# Patient Record
Sex: Female | Born: 1995 | Race: White | Hispanic: No | Marital: Single | Smoking: Former smoker
Health system: Southern US, Community
[De-identification: ages and names within clinical notes are randomized; demographics above are authoritative.]

---
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
## 2016-05-19 ENCOUNTER — Encounter (HOSPITAL_BASED_OUTPATIENT_CLINIC_OR_DEPARTMENT_OTHER): Payer: Self-pay | Admitting: Emergency Medicine

## 2016-05-19 ENCOUNTER — Emergency Department (HOSPITAL_BASED_OUTPATIENT_CLINIC_OR_DEPARTMENT_OTHER): Payer: Self-pay

## 2016-05-19 ENCOUNTER — Emergency Department (HOSPITAL_BASED_OUTPATIENT_CLINIC_OR_DEPARTMENT_OTHER)
Admission: EM | Admit: 2016-05-19 | Discharge: 2016-05-19 | Disposition: A | Discharge status: Home / Self Care | Payer: Self-pay | Attending: Emergency Medicine | Admitting: Emergency Medicine

## 2016-05-19 DIAGNOSIS — Z23 Encounter for immunization: Secondary | ICD-10-CM | POA: Insufficient documentation

## 2016-05-19 DIAGNOSIS — Y9389 Activity, other specified: Secondary | ICD-10-CM | POA: Insufficient documentation

## 2016-05-19 DIAGNOSIS — Y990 Civilian activity done for income or pay: Secondary | ICD-10-CM | POA: Insufficient documentation

## 2016-05-19 DIAGNOSIS — S61112A Laceration without foreign body of left thumb with damage to nail, initial encounter: Secondary | ICD-10-CM | POA: Insufficient documentation

## 2016-05-19 DIAGNOSIS — Z87891 Personal history of nicotine dependence: Secondary | ICD-10-CM | POA: Insufficient documentation

## 2016-05-19 DIAGNOSIS — Y929 Unspecified place or not applicable: Secondary | ICD-10-CM | POA: Insufficient documentation

## 2016-05-19 DIAGNOSIS — W268XXA Contact with other sharp object(s), not elsewhere classified, initial encounter: Secondary | ICD-10-CM | POA: Insufficient documentation

## 2016-05-19 MED ORDER — TETANUS-DIPHTH-ACELL PERTUSSIS 5-2.5-18.5 LF-MCG/0.5 IM SUSP
0.5000 mL | Freq: Once | INTRAMUSCULAR | Status: AC
  Administered 2016-05-19: 0.5 mL via INTRAMUSCULAR
  Filled 2016-05-19: qty 0.5

## 2016-05-19 NOTE — ED Notes (Signed)
Patient returned from X-ray 

## 2016-05-19 NOTE — ED Triage Notes (Signed)
Patient reports she was cutting salad when she "cut the tip of her thumb off", 2-1/2 days ago. Patient reports she placed a bandaid on it, bandaid in place at this time. Patient last tetanus shot unknown.

## 2016-05-19 NOTE — ED Provider Notes (Signed)
[facility] Provider Note   CSN: [id number] Arrival date & time: 05/19/16  1641  By signing my name below, I, Jillian Giles, attest that this documentation has been prepared under the direction and in the presence of Geoffery Lyons, MD. Electronically Signed: Cynda Giles, Scribe. 05/19/16. 5:49 PM.  History   Chief Complaint Chief Complaint  Patient presents with  . Extremity Laceration    left thumb   HPI Comments: Jillian Giles is a 21 y.o. female with no pertinence medical history, who presents to the Emergency Department complaining of a laceration to the left thumb that happened 2 days ago. Patient states she sliced her finger while cutting lettuce at work. Patient states she was unable to get to the hospital because she had to work and this is her first day off. Patient reports associated nail discoloration. Patient reports applying a bandage and pressure to the left thumb with no relief. Last tetanus shot is unknown. Patient denies any nausea, vomiting, fever, or any other symptoms.   The history is provided by the patient. No language interpreter was used.    History reviewed. No pertinent past medical history.  There are no active problems to display for this patient.   History reviewed. No pertinent surgical history.  OB History    No data available       Home Medications    Prior to Admission medications   Medication Sig Start Date End Date Taking? Authorizing Provider  citalopram (CELEXA) 20 MG tablet Take 20 mg by mouth daily.   Yes Historical Provider, MD    Family History History reviewed. No pertinent family history.  Social History Social History  Substance Use Topics  . Smoking status: Former Games developer  . Smokeless tobacco: Never Used  . Alcohol use Yes     Comment: occ     Allergies   Patient has no known allergies.   Review of Systems Review of Systems  Constitutional: Negative for fever.  Gastrointestinal: Negative for nausea  and vomiting.  Skin: Positive for color change and wound (left thumb).  All other systems reviewed and are negative.    Physical Exam Updated Vital Signs BP 110/77 (BP Location: Left Arm)   Pulse 60   Temp 98.5 F (36.9 C) (Oral)   Resp 18   Ht  (1.6 m)   Wt 121 lb (54.9 kg)   LMP 05/08/2016 (Approximate)   SpO2 100%   BMI 21.43 kg/m   Physical Exam  Constitutional: She is oriented to person, place, and time. She appears well-developed and well-nourished. No distress.  HENT:  Head: Normocephalic and atraumatic.  Eyes: EOM are normal.  Neck: Normal range of motion.  Cardiovascular: Normal rate, regular rhythm and normal heart sounds.   Pulmonary/Chest: Effort normal and breath sounds normal.  Abdominal: Soft. She exhibits no distension. There is no tenderness.  Musculoskeletal: Normal range of motion.  Neurological: She is alert and oriented to person, place, and time.  Skin: Skin is warm and dry.  Slight soft tissue amputation of the distal tip of the left thumb. Slight nail involvement.   Psychiatric: She has a normal mood and affect. Judgment normal.  Nursing note and vitals reviewed.    ED Treatments / Results  DIAGNOSTIC STUDIES: Oxygen Saturation is 100% on RA, normal by my interpretation.    COORDINATION OF CARE: 5:48 PM Discussed treatment plan with pt at bedside and pt agreed to plan, which includes a tetanus shot and a splint.  Labs (all labs ordered are listed, but only abnormal results are displayed) Labs Reviewed - No data to display  EKG  EKG Interpretation None       Radiology Dg Finger Thumb Left  Result Date: 05/19/2016 CLINICAL DATA:  Laceration to left thumb today.  Initial encounter. EXAM: LEFT THUMB 2+V COMPARISON:  None. FINDINGS: Bandage material is noted over the distal aspect of the thumb. No fracture, dislocation, focal osseous lesion, or radiopaque foreign body is seen. IMPRESSION: Negative. Electronically Signed   By: Sebastian Ache M.D.   On: 05/19/2016 17:28    Procedures Procedures (including critical care time)  Medications Ordered in ED Medications  Tdap (BOOSTRIX) injection 0.5 mL (not administered)     Initial Impression / Assessment and Plan / ED Course  I have reviewed the triage vital signs and the nursing notes.  Pertinent labs & imaging results that were available during my care of the patient were reviewed by me and considered in my medical decision making (see chart for details).  Small avulsion of distal thumb tip. Local wound care, prn return.  Final Clinical Impressions(s) / ED Diagnoses   Final diagnoses:  None    New Prescriptions New Prescriptions   No medications on file   I personally performed the services described in this documentation, which was scribed in my presence. The recorded information has been reviewed and is accurate.        Geoffery Lyons, MD 05/19/16 [phone]

## 2016-05-19 NOTE — Discharge Instructions (Signed)
Local wound care with bacitracin and dressing changes twice daily.  Return to the emergency department for increased redness, pain, pus straining from the wound, or other new and concerning symptoms.

## 2018-12-29 ENCOUNTER — Other Ambulatory Visit: Payer: Self-pay

## 2018-12-29 DIAGNOSIS — Z20822 Contact with and (suspected) exposure to covid-19: Secondary | ICD-10-CM

## 2018-12-30 LAB — NOVEL CORONAVIRUS, NAA: SARS-CoV-2, NAA: NOT DETECTED

## 2019-06-11 ENCOUNTER — Other Ambulatory Visit: Payer: Self-pay

## 2019-06-11 ENCOUNTER — Emergency Department (HOSPITAL_COMMUNITY): Payer: Medicaid Other

## 2019-06-11 ENCOUNTER — Emergency Department (HOSPITAL_COMMUNITY)
Admission: EM | Admit: 2019-06-11 | Discharge: 2019-06-12 | Disposition: A | Discharge status: Home / Self Care | Payer: Medicaid Other | Attending: Emergency Medicine | Admitting: Emergency Medicine

## 2019-06-11 ENCOUNTER — Encounter (HOSPITAL_COMMUNITY): Payer: Self-pay

## 2019-06-11 DIAGNOSIS — F0781 Postconcussional syndrome: Secondary | ICD-10-CM | POA: Insufficient documentation

## 2019-06-11 DIAGNOSIS — S0990XA Unspecified injury of head, initial encounter: Secondary | ICD-10-CM

## 2019-06-11 DIAGNOSIS — Z79899 Other long term (current) drug therapy: Secondary | ICD-10-CM | POA: Insufficient documentation

## 2019-06-11 DIAGNOSIS — Y9352 Activity, horseback riding: Secondary | ICD-10-CM | POA: Insufficient documentation

## 2019-06-11 DIAGNOSIS — Y999 Unspecified external cause status: Secondary | ICD-10-CM | POA: Insufficient documentation

## 2019-06-11 DIAGNOSIS — H538 Other visual disturbances: Secondary | ICD-10-CM | POA: Insufficient documentation

## 2019-06-11 DIAGNOSIS — Y929 Unspecified place or not applicable: Secondary | ICD-10-CM | POA: Insufficient documentation

## 2019-06-11 LAB — DIFFERENTIAL
Abs Immature Granulocytes: 0.01 10*3/uL (ref 0.00–0.07)
Basophils Absolute: 0 10*3/uL (ref 0.0–0.1)
Basophils Relative: 1 %
Eosinophils Absolute: 0.5 10*3/uL (ref 0.0–0.5)
Eosinophils Relative: 6 %
Immature Granulocytes: 0 %
Lymphocytes Relative: 42 %
Lymphs Abs: 3.4 10*3/uL (ref 0.7–4.0)
Monocytes Absolute: 0.7 10*3/uL (ref 0.1–1.0)
Monocytes Relative: 8 %
Neutro Abs: 3.6 10*3/uL (ref 1.7–7.7)
Neutrophils Relative %: 43 %

## 2019-06-11 LAB — CBC
HCT: 42.2 % (ref 36.0–46.0)
Hemoglobin: 14.3 g/dL (ref 12.0–15.0)
MCH: 32.2 pg (ref 26.0–34.0)
MCHC: 33.9 g/dL (ref 30.0–36.0)
MCV: 95 fL (ref 80.0–100.0)
Platelets: 230 10*3/uL (ref 150–400)
RBC: 4.44 MIL/uL (ref 3.87–5.11)
RDW: 12.3 % (ref 11.5–15.5)
WBC: 8.2 10*3/uL (ref 4.0–10.5)
nRBC: 0 % (ref 0.0–0.2)

## 2019-06-11 LAB — COMPREHENSIVE METABOLIC PANEL
ALT: 20 U/L (ref 0–44)
AST: 19 U/L (ref 15–41)
Albumin: 3.6 g/dL (ref 3.5–5.0)
Alkaline Phosphatase: 51 U/L (ref 38–126)
Anion gap: 10 (ref 5–15)
BUN: 9 mg/dL (ref 6–20)
CO2: 23 mmol/L (ref 22–32)
Calcium: 9.2 mg/dL (ref 8.9–10.3)
Chloride: 100 mmol/L (ref 98–111)
Creatinine, Ser: 1 mg/dL (ref 0.44–1.00)
GFR calc Af Amer: >60 mL/min (ref >60)
GFR calc non Af Amer: >60 mL/min (ref >60)
Glucose, Bld: 97 mg/dL (ref 70–99)
Potassium: 3.8 mmol/L (ref 3.5–5.1)
Sodium: 133 mmol/L — ABNORMAL LOW (ref 135–145)
Total Bilirubin: 0.4 mg/dL (ref 0.3–1.2)
Total Protein: 6.6 g/dL (ref 6.5–8.1)

## 2019-06-11 LAB — I-STAT BETA HCG BLOOD, ED (MC, WL, AP ONLY): I-stat hCG, quantitative: <5 m[IU]/mL (ref <5)

## 2019-06-11 LAB — I-STAT CHEM 8, ED
BUN: 12 mg/dL (ref 6–20)
Calcium, Ion: 1.14 mmol/L — ABNORMAL LOW (ref 1.15–1.40)
Chloride: 104 mmol/L (ref 98–111)
Creatinine, Ser: 1 mg/dL (ref 0.44–1.00)
Glucose, Bld: 86 mg/dL (ref 70–99)
HCT: 42 % (ref 36.0–46.0)
Hemoglobin: 14.3 g/dL (ref 12.0–15.0)
Potassium: 4 mmol/L (ref 3.5–5.1)
Sodium: 141 mmol/L (ref 135–145)
TCO2: 29 mmol/L (ref 22–32)

## 2019-06-11 LAB — PROTIME-INR
INR: 1 (ref 0.8–1.2)
Prothrombin Time: 12.8 seconds (ref 11.4–15.2)

## 2019-06-11 LAB — APTT: aPTT: 28 seconds (ref 24–36)

## 2019-06-11 MED ORDER — SODIUM CHLORIDE 0.9% FLUSH
3.0000 mL | Freq: Once | INTRAVENOUS | Status: DC
Start: 2019-06-11 — End: 2019-06-12

## 2019-06-11 NOTE — ED Triage Notes (Addendum)
Pt arrives to ED w/ c/o head injury secondary to being thrown from a horse last weds. Pt does not remember event. Pt endorses intermittent aphasia, headache, and "not feeling herself since then".

## 2019-06-12 NOTE — ED Notes (Signed)
Patient verbalizes understanding of discharge instructions. Opportunity for questioning and answers were provided. Armband removed by staff, pt discharged from ED ambulatory to home.  

## 2019-06-12 NOTE — ED Provider Notes (Signed)
[facility] EMERGENCY DEPARTMENT Provider Note  CSN: [id number] Arrival date & time: 06/11/19 2225  Chief Complaint(s) Head Injury  HPI Jillian Giles is a 24 y.o. female who presents to the emergency department for evaluation after falling off of a horse last week.  Patient reports that she might of passed out during that time.  Since then she has been having mild headache with intermittent blurry vision in the right eye in the right lower quadrant as well as intermittent bouts of word finding difficulty and discoordination.  These episodes are short-lived and resolve sporadically.  There is no associated nausea or vomiting.  Patient is able to ambulate well.  No neck or other physical injuries related to the fall.  HPI     Past Medical History History reviewed. No pertinent past medical history. There are no problems to display for this patient.  Home Medication(s) Prior to Admission medications   Medication Sig Start Date End Date Taking? Authorizing Provider  citalopram (CELEXA) 20 MG tablet Take 20 mg by mouth daily.    [provider]                                                                                                                                    Past Surgical History History reviewed. No pertinent surgical history. Family History History reviewed. No pertinent family history.  Social History Social History   Tobacco Use  . Smoking status: Former Games developer  . Smokeless tobacco: Never Used  Substance Use Topics  . Alcohol use: Yes    Comment: occ  . Drug use: Yes    Types: Marijuana    Comment: occ   Allergies Iodine  Review of Systems Review of Systems All other systems are reviewed and are negative for acute change except as noted in the HPI  Physical Exam Vital Signs  I have reviewed the triage vital signs BP 109/69   Pulse 66   Temp 98.3 F (36.8 C) (Oral)   Resp 15   SpO2 100%   Physical Exam Vitals  reviewed.  Constitutional:      General: She is not in acute distress.    Appearance: She is well-developed. She is not diaphoretic.  HENT:     Head: Normocephalic and atraumatic.     Right Ear: External ear normal.     Left Ear: External ear normal.     Nose: Nose normal.  Eyes:     General: No scleral icterus.    Conjunctiva/sclera: Conjunctivae normal.  Neck:     Trachea: Phonation normal.  Cardiovascular:     Rate and Rhythm: Normal rate and regular rhythm.  Pulmonary:     Effort: Pulmonary effort is normal. No respiratory distress.     Breath sounds: No stridor.  Abdominal:     General: There is no distension.  Musculoskeletal:        General: Normal range of  motion.     Cervical back: Normal range of motion.  Neurological:     Mental Status: She is alert and oriented to person, place, and time.     Comments: Mental Status:  Alert and oriented to person, place, and time.  Attention and concentration normal.  Speech clear.  Recent memory is intact  Cranial Nerves:  II Visual Fields: Intact to confrontation. Visual fields intact. III, IV, VI: Pupils equal and reactive to light and near. Full eye movement without nystagmus  V Facial Sensation: Normal. No weakness of masticatory muscles  VII: No facial weakness or asymmetry  VIII Auditory Acuity: Grossly normal  IX/X: The uvula is midline; the palate elevates symmetrically  XI: Normal sternocleidomastoid and trapezius strength  XII: The tongue is midline. No atrophy or fasciculations.   Motor System: Muscle Strength: 5/5 and symmetric in the upper and lower extremities. No pronation or drift.  Muscle Tone: Tone and muscle bulk are normal in the upper and lower extremities.   Reflexes: DTRs: 1+ and symmetrical in all four extremities. No Clonus Coordination: Intact finger-to-nose. No tremor.  Sensation: Intact to light touch..  Gait: Routine gait normal.   Psychiatric:        Behavior: Behavior normal.     ED  Results and Treatments Labs (all labs ordered are listed, but only abnormal results are displayed) Labs Reviewed  COMPREHENSIVE METABOLIC PANEL - Abnormal; Notable for the following components:      Result Value   Sodium 133 (*)    All other components within normal limits  I-STAT CHEM 8, ED - Abnormal; Notable for the following components:   Calcium, Ion 1.14 (*)    All other components within normal limits  PROTIME-INR  APTT  CBC  DIFFERENTIAL  I-STAT BETA HCG BLOOD, ED ([facility], [facility], [facility] ONLY)  CBG MONITORING, ED                                                                                                                         EKG  EKG Interpretation  Date/Time:  Thursday June 11 2019 22:40:41 EDT Ventricular Rate:  76 PR Interval:    QRS Duration: 90 QT Interval:  386 QTC Calculation: 434 R Axis:   87 Text Interpretation: Sinus rhythm Artifact Abnormal ECG NO STEMI. No old tracing to compare Confirmed by Drema Pry [phone]) on 06/12/2019 12:12:52 AM      Radiology CT HEAD WO CONTRAST  Result Date: 06/11/2019 CLINICAL DATA:  Altered mental status after recent head trauma. Possible stroke. Thrown from a horse last week. Intermittent aphasia and headache. EXAM: CT HEAD WITHOUT CONTRAST TECHNIQUE: Contiguous axial images were obtained from the base of the skull through the vertex without intravenous contrast. COMPARISON:  None. FINDINGS: Brain: No intracranial hemorrhage, mass effect, or midline shift. No hydrocephalus. The basilar cisterns are patent. No evidence of territorial infarct or acute ischemia. No extra-axial or intracranial fluid collection. Vascular: No hyperdense vessel or unexpected calcification. Skull: Normal. Negative  for fracture or focal lesion. Sinuses/Orbits: Mucosal thickening and debris within the sphenoid sinus. Paranasal sinuses are otherwise clear. No acute orbital abnormality. Mastoid air cells are clear. Other: None. IMPRESSION: 1. No acute intracranial  abnormality. No skull fracture. 2. Sphenoid sinus mucosal thickening and bubbly debris. Electronically Signed   By: Keith Rake M.D.   On: 06/11/2019 23:12    Pertinent labs & imaging results that were available during my care of the patient were reviewed by me and considered in my medical decision making (see chart for details).  Medications Ordered in ED Medications  sodium chloride flush (NS) 0.9 % injection 3 mL (0 mLs Intravenous Hold 06/12/19 0127)                                                                                                                                    Procedures Procedures  (including critical care time)  Medical Decision Making / ED Course I have reviewed the nursing notes for this encounter and the patient's prior records (if available in EHR or on provided paperwork).   Amiracle Neises was evaluated in Emergency Department on 06/12/2019 for the symptoms described in the history of present illness. She was evaluated in the context of the global COVID-19 pandemic, which necessitated consideration that the patient might be at risk for infection with the SARS-CoV-2 virus that causes COVID-19. Institutional protocols and algorithms that pertain to the evaluation of patients at risk for COVID-19 are in a state of rapid change based on information released by regulatory bodies including the CDC and federal and state organizations. These policies and algorithms were followed during the patient's care in the ED.  CT head negative.  Labs reassuring. Presentation is consistent with postconcussive syndrome. Right eye symptoms may be related to post concussive syndrome versus small retinal tear.  Recommended ophthalmology follow-up. With regards to the postconcussive syndrome, recommended either sports medicine or neurology follow-up.      Final Clinical Impression(s) / ED Diagnoses Final diagnoses:  Post concussion syndrome  Injury of head, initial encounter    The patient appears reasonably screened and/or stabilized for discharge and I doubt any other medical condition or other [facility] requiring further screening, evaluation, or treatment in the ED at this time prior to discharge. Safe for discharge with strict return precautions.  Disposition: Discharge  Condition: Good  I have discussed the results, Dx and Tx plan with the patient/family who expressed understanding and agree(s) with the plan. Discharge instructions discussed at length. The patient/family was given strict return precautions who verbalized understanding of the instructions. No further questions at time of discharge.    ED Discharge Orders    None        Follow Up: Rosemarie Ax, MD [place] [address] [phone]     [facility] [address]     [place]  [address] [phone]    Ophthalmology         This chart was dictated using voice recognition software.  Despite best efforts to proofread,  errors can occur which can change the documentation meaning.   Nira Conn, MD 06/12/19 [phone]

## 2020-11-02 IMAGING — CT CT HEAD W/O CM
3 series · 14 of 47 positions shown, 16 images · non-contrast
Comparison: None.

CLINICAL DATA: Altered mental status after recent head trauma.
Possible stroke. Thrown from a horse last week. Intermittent aphasia
and headache.

EXAM:
CT HEAD WITHOUT CONTRAST
TECHNIQUE: Contiguous axial images were obtained from the base of the skull
through the vertex without intravenous contrast.

[Series 3: head 5.0 h30s · axial · 0.42mm/px · z∈[-86,+54]mm · 8 of 34 slices shown, 10 images]
[im 3/34  brain]
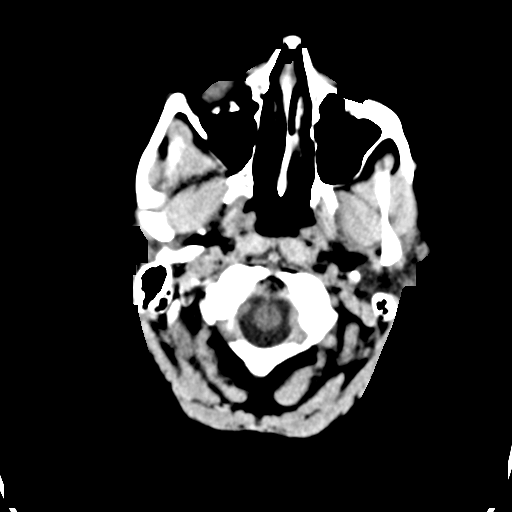
[im 3/34  bone]
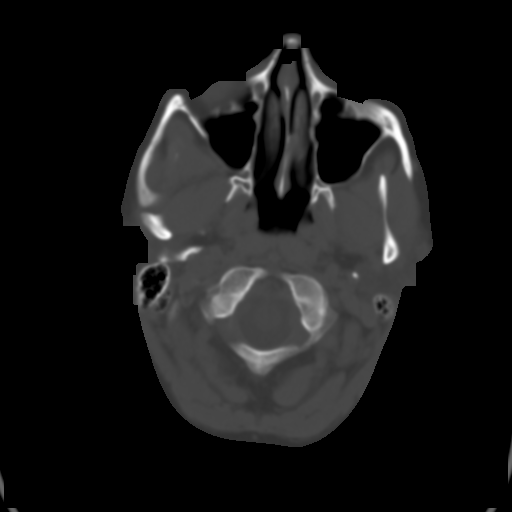
[im 7/34  brain]
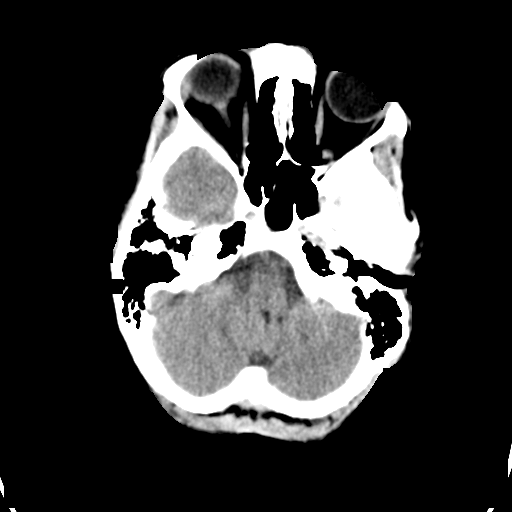
[im 11/34  brain]
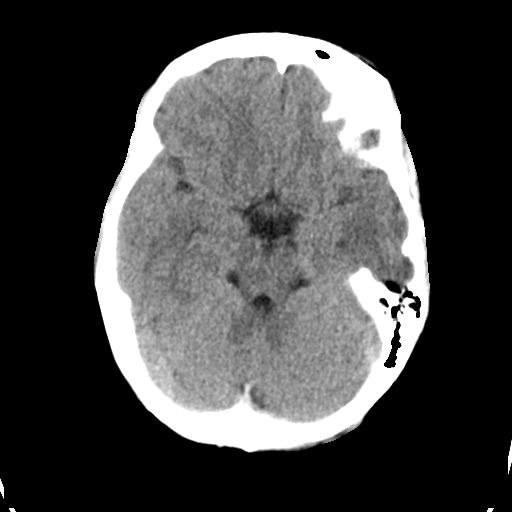
[im 15/34  brain]
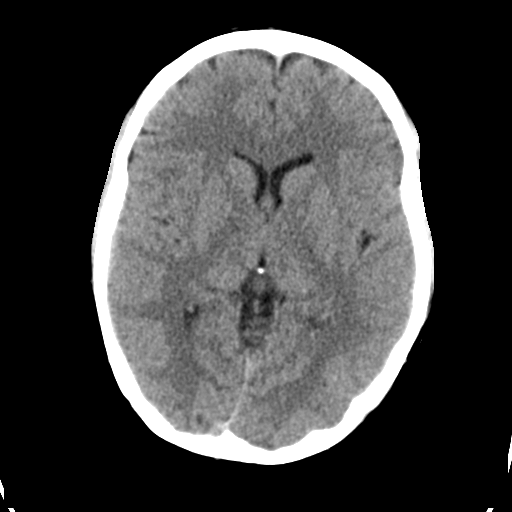
[im 19/34  brain]
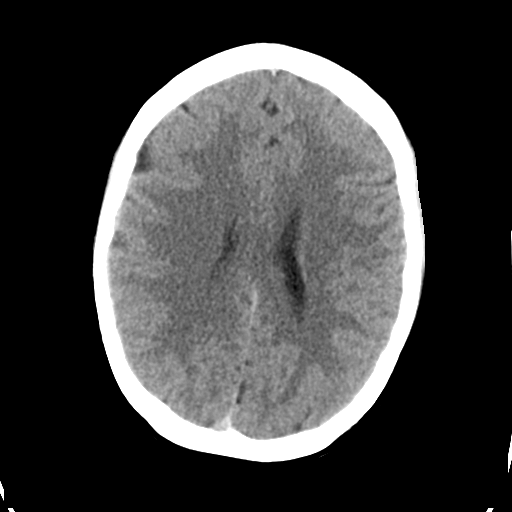
[im 19/34  bone]
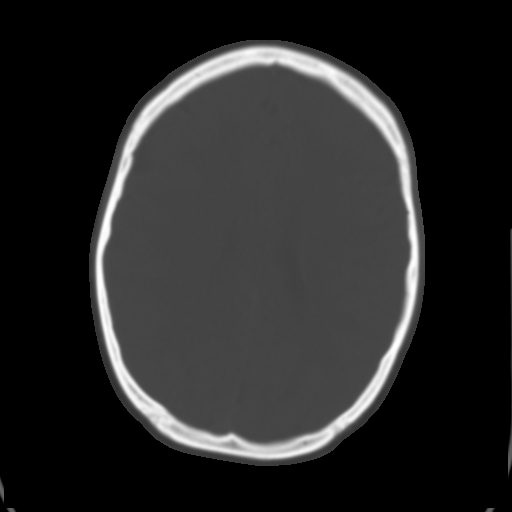
[im 23/34  brain]
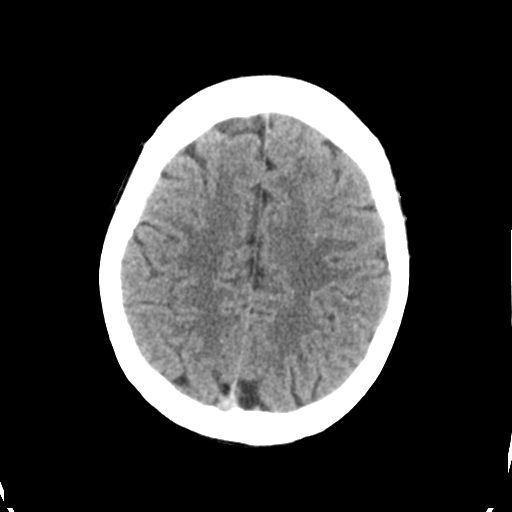
[im 27/34  brain]
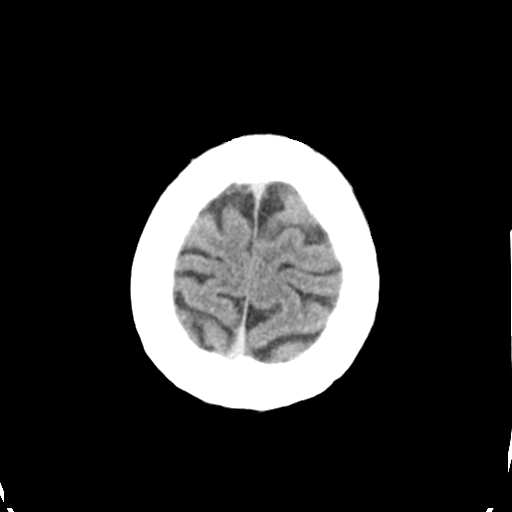
[im 31/34  brain]
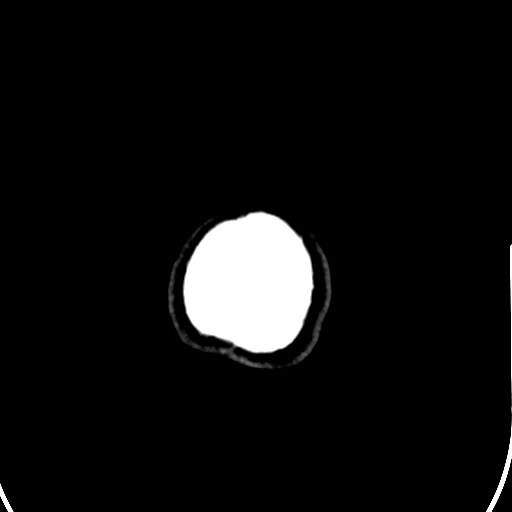

[Series 5: head 3.0 mpr cor · coronal · 0.33mm/px · 3 of 80 slices shown]
[im 27/80  brain]
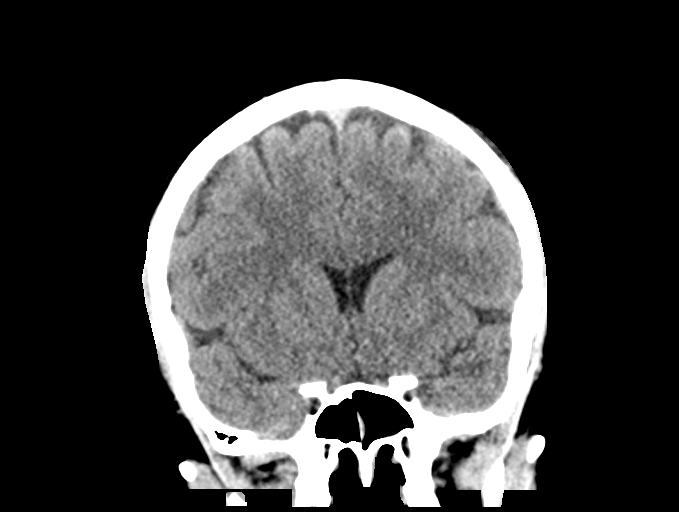
[im 36/80  brain]
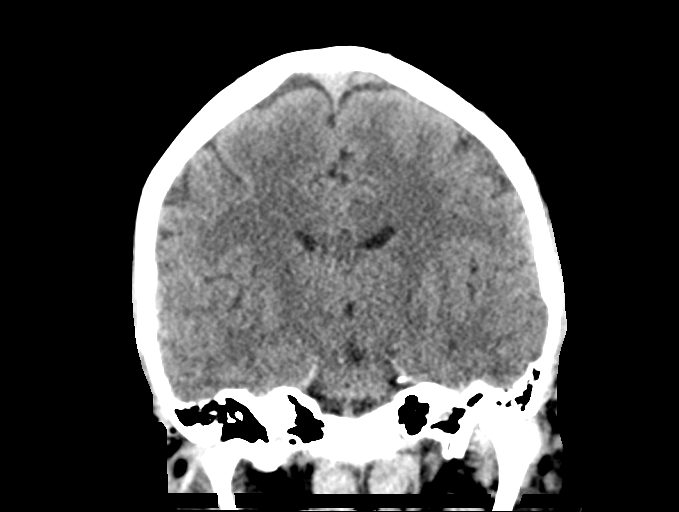
[im 44/80  brain]
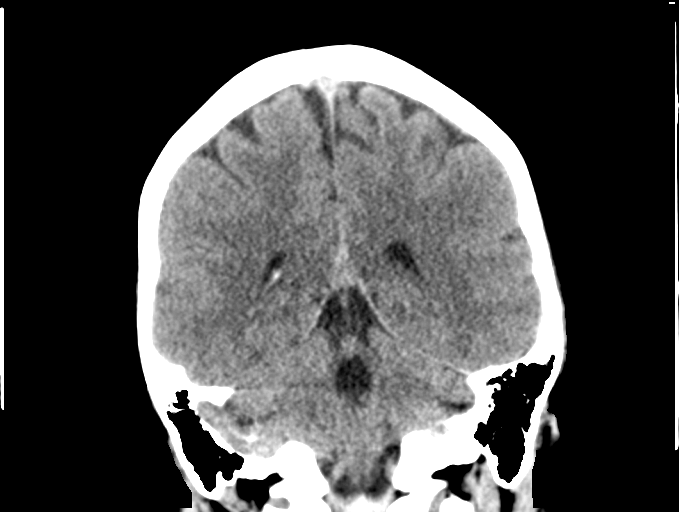

[Series 6: head 3.0 mpr sag · sagittal · 0.33mm/px · 3 of 67 slices shown]
[im 23/67  brain]
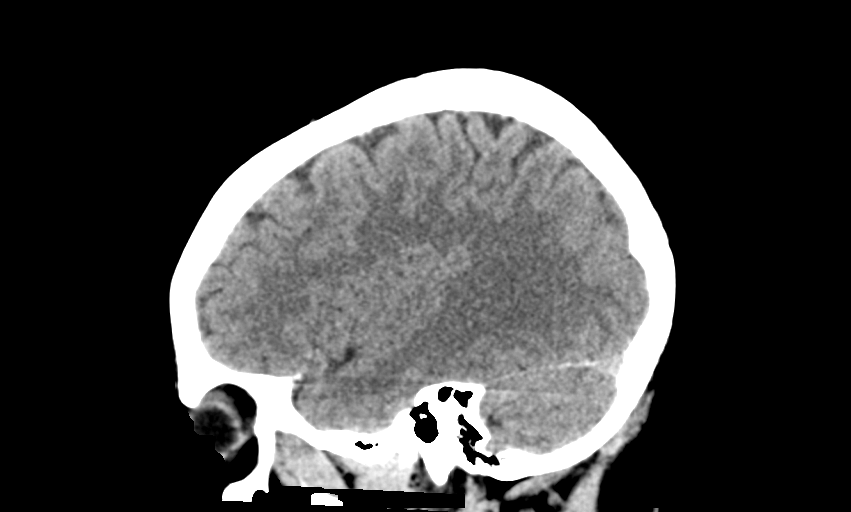
[im 34/67  brain]
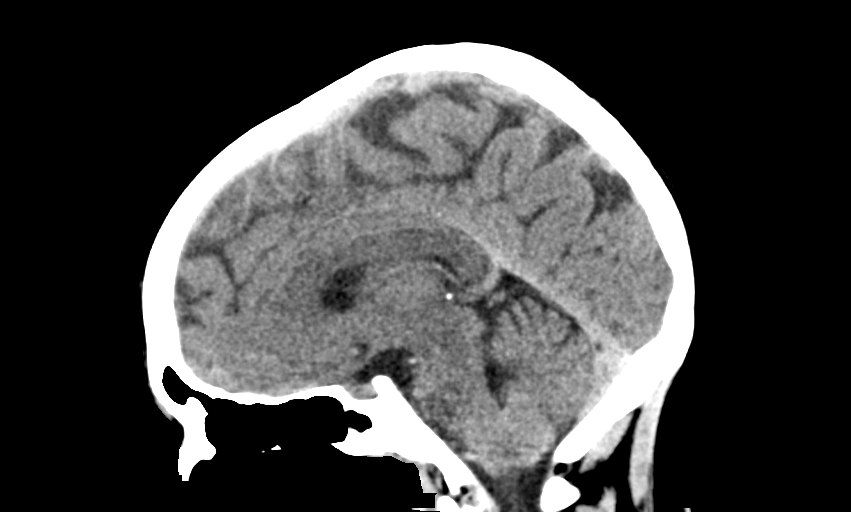
[im 45/67  brain]
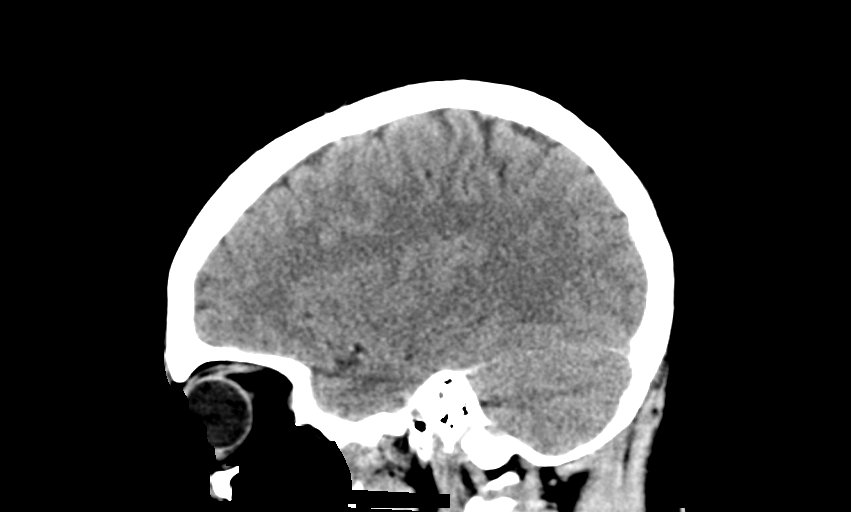

[14 of 47 positions shown; findings below may reference images not displayed]

FINDINGS: Brain: No intracranial hemorrhage, mass effect, or midline shift. No
hydrocephalus. The basilar cisterns are patent. No evidence of
territorial infarct or acute ischemia. No extra-axial or
intracranial fluid collection.

Vascular: No hyperdense vessel or unexpected calcification.

Skull: Normal. Negative for fracture or focal lesion.

Sinuses/Orbits: Mucosal thickening and debris within the sphenoid
sinus. Paranasal sinuses are otherwise clear. No acute orbital
abnormality. Mastoid air cells are clear.

Other: None.
IMPRESSION: 1. No acute intracranial abnormality. No skull fracture.
2. Sphenoid sinus mucosal thickening and bubbly debris.
